# Patient Record
Sex: Male | Born: 1960 | Race: White | Hispanic: No | Smoking: Never smoker
Health system: Southern US, Community
[De-identification: ages and names within clinical notes are randomized; demographics above are authoritative.]

## PROBLEM LIST (undated history)

## (undated) DIAGNOSIS — E119 Type 2 diabetes mellitus without complications: Secondary | ICD-10-CM

## (undated) DIAGNOSIS — I10 Essential (primary) hypertension: Secondary | ICD-10-CM

## (undated) DIAGNOSIS — H409 Unspecified glaucoma: Secondary | ICD-10-CM

## (undated) DIAGNOSIS — N200 Calculus of kidney: Secondary | ICD-10-CM

## (undated) HISTORY — PX: CARPAL TUNNEL RELEASE: SHX101

## (undated) HISTORY — PX: KNEE ARTHROSCOPY: SUR90

## (undated) HISTORY — PX: SHOULDER ARTHROSCOPY: SHX128

---

## 2017-05-14 ENCOUNTER — Emergency Department (HOSPITAL_COMMUNITY)
Admission: EM | Admit: 2017-05-14 | Discharge: 2017-05-14 | Disposition: A | Discharge status: Home / Self Care | Payer: Medicare HMO | Attending: Emergency Medicine | Admitting: Emergency Medicine

## 2017-05-14 ENCOUNTER — Other Ambulatory Visit: Payer: Self-pay

## 2017-05-14 ENCOUNTER — Emergency Department (HOSPITAL_COMMUNITY): Payer: Medicare HMO

## 2017-05-14 ENCOUNTER — Encounter (HOSPITAL_COMMUNITY): Payer: Self-pay | Admitting: *Deleted

## 2017-05-14 DIAGNOSIS — L03115 Cellulitis of right lower limb: Secondary | ICD-10-CM

## 2017-05-14 DIAGNOSIS — R2241 Localized swelling, mass and lump, right lower limb: Secondary | ICD-10-CM | POA: Diagnosis present

## 2017-05-14 DIAGNOSIS — I10 Essential (primary) hypertension: Secondary | ICD-10-CM | POA: Insufficient documentation

## 2017-05-14 DIAGNOSIS — E119 Type 2 diabetes mellitus without complications: Secondary | ICD-10-CM | POA: Insufficient documentation

## 2017-05-14 HISTORY — DX: Calculus of kidney: N20.0

## 2017-05-14 HISTORY — DX: Essential (primary) hypertension: I10

## 2017-05-14 HISTORY — DX: Unspecified glaucoma: H40.9

## 2017-05-14 HISTORY — DX: Type 2 diabetes mellitus without complications: E11.9

## 2017-05-14 LAB — COMPREHENSIVE METABOLIC PANEL
ALBUMIN: 3.5 g/dL (ref 3.5–5.0)
ALK PHOS: 76 U/L (ref 38–126)
ALT: 17 U/L (ref 17–63)
ANION GAP: 4 — AB (ref 5–15)
AST: 22 U/L (ref 15–41)
BUN: 8 mg/dL (ref 6–20)
CALCIUM: 8.8 mg/dL — AB (ref 8.9–10.3)
CO2: 26 mmol/L (ref 22–32)
Chloride: 104 mmol/L (ref 101–111)
Creatinine, Ser: 0.73 mg/dL (ref 0.61–1.24)
GFR calc Af Amer: >60 mL/min (ref >60)
GFR calc non Af Amer: >60 mL/min (ref >60)
GLUCOSE: 264 mg/dL — AB (ref 65–99)
Potassium: 4 mmol/L (ref 3.5–5.1)
SODIUM: 134 mmol/L — AB (ref 135–145)
Total Bilirubin: 1.5 mg/dL — ABNORMAL HIGH (ref 0.3–1.2)
Total Protein: 6.8 g/dL (ref 6.5–8.1)

## 2017-05-14 LAB — URINALYSIS, ROUTINE W REFLEX MICROSCOPIC
BACTERIA UA: NONE SEEN
Bilirubin Urine: NEGATIVE
Glucose, UA: >=500 mg/dL — AB
Hgb urine dipstick: NEGATIVE
Ketones, ur: NEGATIVE mg/dL
LEUKOCYTES UA: NEGATIVE
Nitrite: NEGATIVE
PH: 6 (ref 5.0–8.0)
Protein, ur: NEGATIVE mg/dL
SPECIFIC GRAVITY, URINE: 1.02 (ref 1.005–1.030)

## 2017-05-14 LAB — CBC WITH DIFFERENTIAL/PLATELET
BASOS ABS: 0.1 10*3/uL (ref 0.0–0.1)
BASOS PCT: 1 %
EOS ABS: 0.2 10*3/uL (ref 0.0–0.7)
Eosinophils Relative: 3 %
HCT: 44.2 % (ref 39.0–52.0)
HEMOGLOBIN: 15.4 g/dL (ref 13.0–17.0)
Lymphocytes Relative: 28 %
Lymphs Abs: 2 10*3/uL (ref 0.7–4.0)
MCH: 29.4 pg (ref 26.0–34.0)
MCHC: 34.8 g/dL (ref 30.0–36.0)
MCV: 84.4 fL (ref 78.0–100.0)
MONOS PCT: 9 %
Monocytes Absolute: 0.6 10*3/uL (ref 0.1–1.0)
NEUTROS ABS: 4.2 10*3/uL (ref 1.7–7.7)
NEUTROS PCT: 59 %
Platelets: 117 10*3/uL — ABNORMAL LOW (ref 150–400)
RBC: 5.24 MIL/uL (ref 4.22–5.81)
RDW: 13.8 % (ref 11.5–15.5)
WBC: 7.1 10*3/uL (ref 4.0–10.5)

## 2017-05-14 MED ORDER — HYDROCODONE-ACETAMINOPHEN 5-325 MG PO TABS
1.0000 | ORAL_TABLET | Freq: Once | ORAL | Status: AC
  Administered 2017-05-14: 1 via ORAL
  Filled 2017-05-14: qty 1

## 2017-05-14 MED ORDER — CLINDAMYCIN HCL 300 MG PO CAPS: 300.0000 mg | ORAL_CAPSULE | Freq: Four times a day (QID) | ORAL | 0 refills | Status: AC

## 2017-05-14 MED ORDER — CLINDAMYCIN PHOSPHATE 600 MG/50ML IV SOLN
600.0000 mg | Freq: Once | INTRAVENOUS | Status: AC
  Administered 2017-05-14: 600 mg via INTRAVENOUS
  Filled 2017-05-14: qty 50

## 2017-05-14 MED ORDER — HYDROCODONE-ACETAMINOPHEN 5-325 MG PO TABS
1.0000 | ORAL_TABLET | Freq: Four times a day (QID) | ORAL | 0 refills | Status: AC | PRN
Start: 2017-05-14 — End: ?

## 2017-05-14 NOTE — ED Triage Notes (Signed)
Pt c/o swelling, redness, pain to RLE x couple of days.

## 2017-05-14 NOTE — ED Notes (Signed)
ED Provider at bedside. 

## 2017-05-14 NOTE — Discharge Instructions (Signed)
Follow-up with your primary provider early next week to reevaluate improvement of your cellulitis.  Return immediately for any worsening pain, fever, redness or concerns

## 2017-05-14 NOTE — ED Provider Notes (Signed)
[facility] EMERGENCY DEPARTMENT Provider Note   CSN: [id number] Arrival date & time: 05/14/17  4098     History   Chief Complaint Chief Complaint  Patient presents with  . Leg Swelling    HPI Logan Gibson is a 56 y.o. male.  HPI Patient states he was splitting wood 3 days ago and sustained abrasions to the right pretibial area.  Over the last 2 days he has had redness, warmth and swelling to the area.  Admits to fatigue and generalized weakness but denies any fever or chills.  No shortness of breath or chest pain.  States he has had cellulitis in the past with similar symptoms. Past Medical History:  Diagnosis Date  . Diabetes mellitus without complication (HCC)   . Glaucoma   . Hypertension   . Kidney stones     There are no active problems to display for this patient.   Past Surgical History:  Procedure Laterality Date  . CARPAL TUNNEL RELEASE Bilateral   . KNEE ARTHROSCOPY Bilateral   . SHOULDER ARTHROSCOPY Left        Home Medications    Prior to Admission medications   Medication Sig Start Date End Date Taking? Authorizing Provider  clindamycin (CLEOCIN) 300 MG capsule Take 1 capsule (300 mg total) by mouth 4 (four) times daily. X 7 days 05/14/17   Loren Racer, MD  HYDROcodone-acetaminophen [facility]) 5-325 MG tablet Take 1 tablet by mouth every 6 (six) hours as needed for severe pain. 05/14/17   Loren Racer, MD    Family History No family history on file.  Social History Social History   Tobacco Use  . Smoking status: Never Smoker  . Smokeless tobacco: Never Used  Substance Use Topics  . Alcohol use: No    Frequency: Never  . Drug use: No     Allergies   Tramadol   Review of Systems Review of Systems  Constitutional: Positive for fatigue. Negative for chills and fever.  Respiratory: Negative for cough and shortness of breath.   Cardiovascular: Positive for leg swelling. Negative for chest pain.  Gastrointestinal: Negative for  abdominal pain, nausea and vomiting.  Genitourinary: Negative for dysuria and frequency.  Musculoskeletal: Negative for arthralgias, back pain and joint swelling.  Skin: Positive for color change, rash and wound.  Neurological: Positive for weakness. Negative for dizziness, light-headedness, numbness and headaches.  All other systems reviewed and are negative.    Physical Exam Updated Vital Signs BP 136/76 (BP Location: Left Arm)   Pulse 82   Temp 97.8 F (36.6 C) (Oral)   Resp 18   Ht 5\' 8"  (1.727 m)   Wt 122.5 kg (270 lb)   SpO2 99%   BMI 41.05 kg/m   Physical Exam  Constitutional: He is oriented to person, place, and time. He appears well-developed and well-nourished. No distress.  HENT:  Head: Normocephalic and atraumatic.  Mouth/Throat: Oropharynx is clear and moist.  Eyes: EOM are normal. Pupils are equal, round, and reactive to light.  Neck: Normal range of motion. Neck supple.  Cardiovascular: Normal rate and regular rhythm.  Pulmonary/Chest: Effort normal and breath sounds normal.  Abdominal: Soft. Bowel sounds are normal. There is no tenderness. There is no rebound and no guarding.  Musculoskeletal: Normal range of motion. He exhibits edema and tenderness.  Patient has multiple small superficial abrasions to the pretibial region on the right.  There is surrounding erythema, warmth and tenderness to palpation.  No calf tenderness.  Distal pulses are 2+.  Full range of motion of the right knee and ankle without discomfort.  Neurological: He is alert and oriented to person, place, and time.  Skin: Skin is warm and dry. No rash noted. No erythema.  Psychiatric: He has a normal mood and affect. His behavior is normal.  Nursing note and vitals reviewed.    ED Treatments / Results  Labs (all labs ordered are listed, but only abnormal results are displayed) Labs Reviewed  CBC WITH DIFFERENTIAL/PLATELET - Abnormal; Notable for the following components:      Result Value    Platelets 117 (*)    All other components within normal limits  COMPREHENSIVE METABOLIC PANEL - Abnormal; Notable for the following components:   Sodium 134 (*)    Glucose, Bld 264 (*)    Calcium 8.8 (*)    Total Bilirubin 1.5 (*)    Anion gap 4 (*)    All other components within normal limits  URINALYSIS, ROUTINE W REFLEX MICROSCOPIC - Abnormal; Notable for the following components:   Glucose, UA >=500 (*)    Squamous Epithelial / LPF 0-5 (*)    All other components within normal limits    EKG  EKG Interpretation None       Radiology [place] Venous Img Lower Unilateral Right  Result Date: 05/14/2017 CLINICAL DATA:  56 year old male with a history of right lower extremity edema EXAM: RIGHT LOWER EXTREMITY VENOUS DOPPLER ULTRASOUND TECHNIQUE: Gray-scale sonography with graded compression, as well as color Doppler and duplex ultrasound were performed to evaluate the lower extremity deep venous systems from the level of the common femoral vein and including the common femoral, femoral, profunda femoral, popliteal and calf veins including the posterior tibial, peroneal and gastrocnemius veins when visible. The superficial great saphenous vein was also interrogated. Spectral Doppler was utilized to evaluate flow at rest and with distal augmentation maneuvers in the common femoral, femoral and popliteal veins. COMPARISON:  None. FINDINGS: Contralateral Common Femoral Vein: Respiratory phasicity is normal and symmetric with the symptomatic side. No evidence of thrombus. Normal compressibility. Common Femoral Vein: No evidence of thrombus. Normal compressibility, respiratory phasicity and response to augmentation. Saphenofemoral Junction: No evidence of thrombus. Normal compressibility and flow on color Doppler imaging. Profunda Femoral Vein: No evidence of thrombus. Normal compressibility and flow on color Doppler imaging. Femoral Vein: No evidence of thrombus. Normal compressibility, respiratory  phasicity and response to augmentation. Popliteal Vein: No evidence of thrombus. Normal compressibility, respiratory phasicity and response to augmentation. Calf Veins: No evidence of thrombus. Normal compressibility and flow on color Doppler imaging. Superficial Great Saphenous Vein: No evidence of thrombus. Normal compressibility and flow on color Doppler imaging. Other Findings:  Mild edema. IMPRESSION: Sonographic survey of the right lower extremity negative for DVT. Mild edema of the calf. Electronically Signed   By: Gilmer MorJaime  Wagner D.O.   On: 05/14/2017 11:56    Procedures Procedures (including critical care time)  Medications Ordered in ED Medications  clindamycin (CLEOCIN) IVPB 600 mg (0 mg Intravenous Stopped 05/14/17 1254)  HYDROcodone-acetaminophen (NORCO/VICODIN) 5-325 MG per tablet 1 tablet (1 tablet Oral Given 05/14/17 1259)     Initial Impression / Assessment and Plan / ED Course  I have reviewed the triage vital signs and the nursing notes.  Pertinent labs & imaging results that were available during my care of the patient were reviewed by me and considered in my medical decision making (see chart for details).     Patient is very well-appearing.  Vital signs are stable.  Afebrile and normal white blood cell count.  Given dose of IV clindamycin in the emergency department.  Patient states he would like to try oral antibiotics.  Will follow up with his primary physician next week and return immediately for any worsening symptoms or concerns.  Final Clinical Impressions(s) / ED Diagnoses   Final diagnoses:  Cellulitis of right leg    ED Discharge Orders        Ordered    clindamycin (CLEOCIN) 300 MG capsule  4 times daily     05/14/17 1249    HYDROcodone-acetaminophen (NORCO) 5-325 MG tablet  Every 6 hours PRN     05/14/17 1249       Loren RacerYelverton, Babyboy Loya, MD 05/14/17 1301

## 2019-04-19 IMAGING — US US EXTREM LOW VENOUS*R*
1 series · 13 of 24 positions shown · non-contrast
Comparison: None.

CLINICAL DATA: 56-year-old male with a history of right lower
extremity edema



[Series 1: us extrem low venous*right* · 0.10mm/px · 13 of 38 slices shown]
[im 1/38]
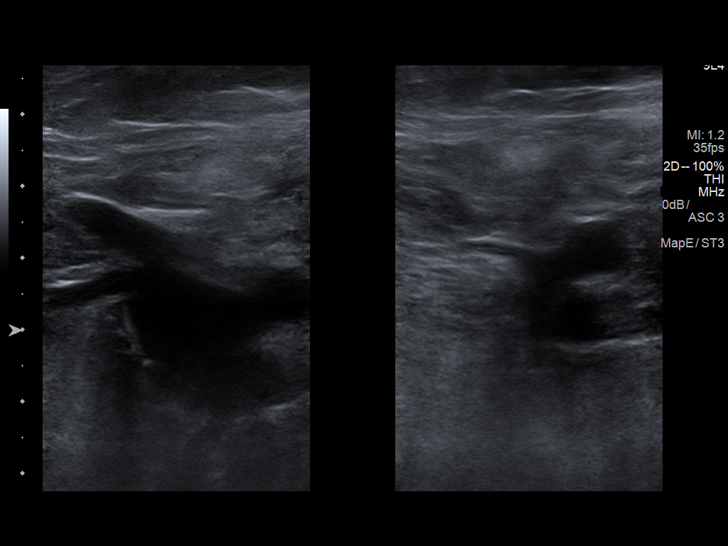
[im 4/38]
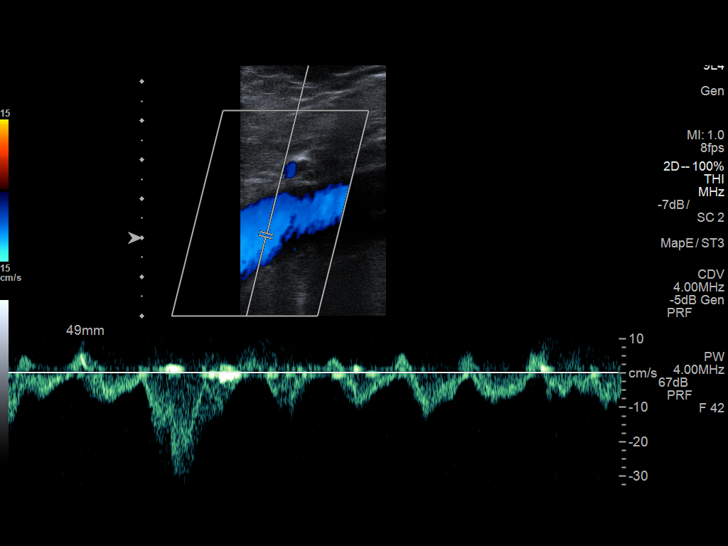
[im 7/38]
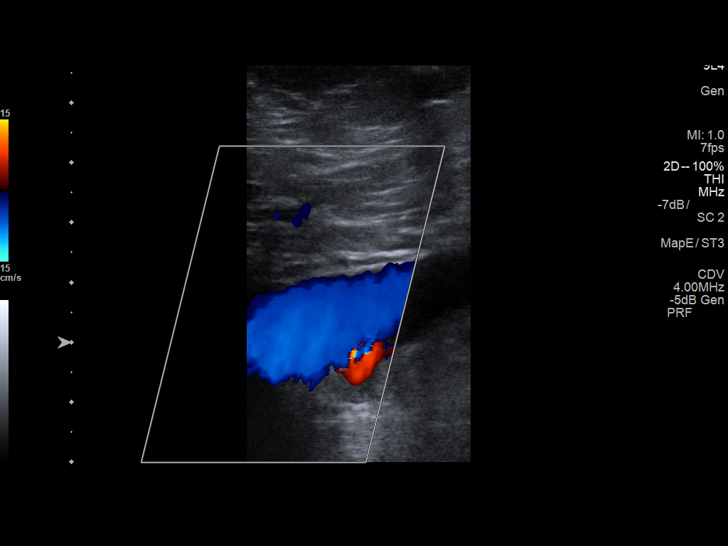
[im 10/38]
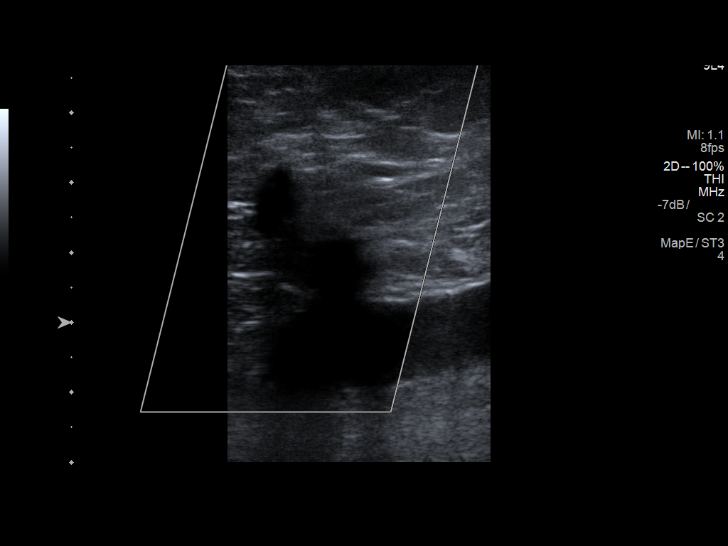
[im 13/38]
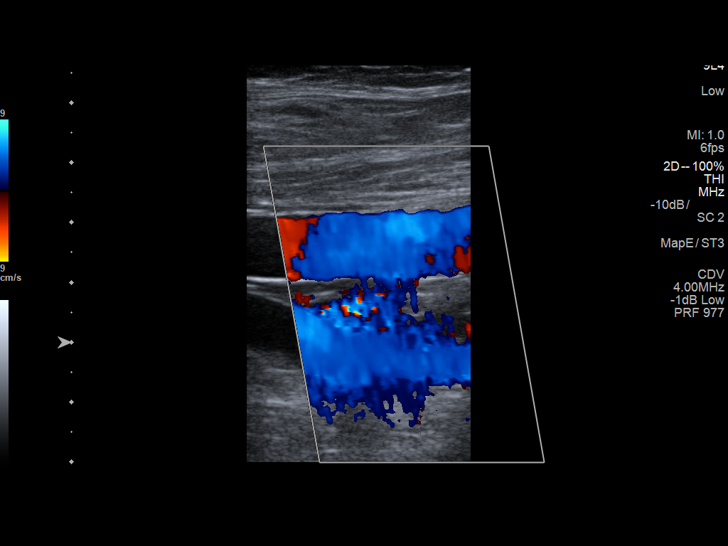
[im 17/38]
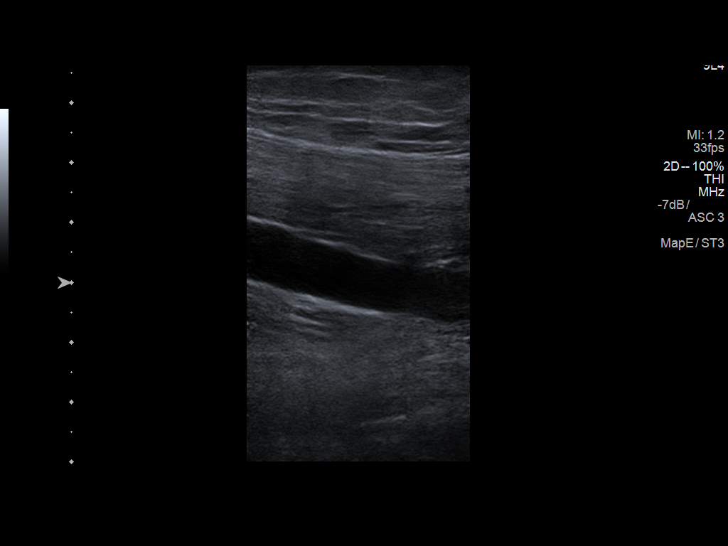
[im 20/38]
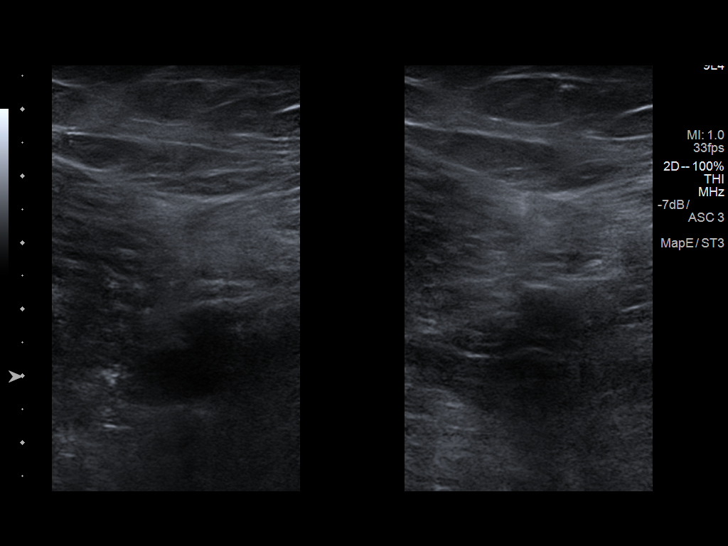
[im 21/38]
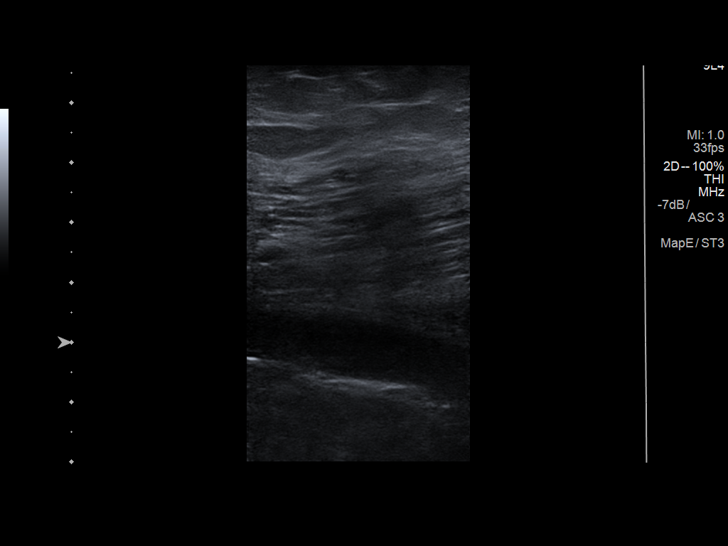
[im 25/38]
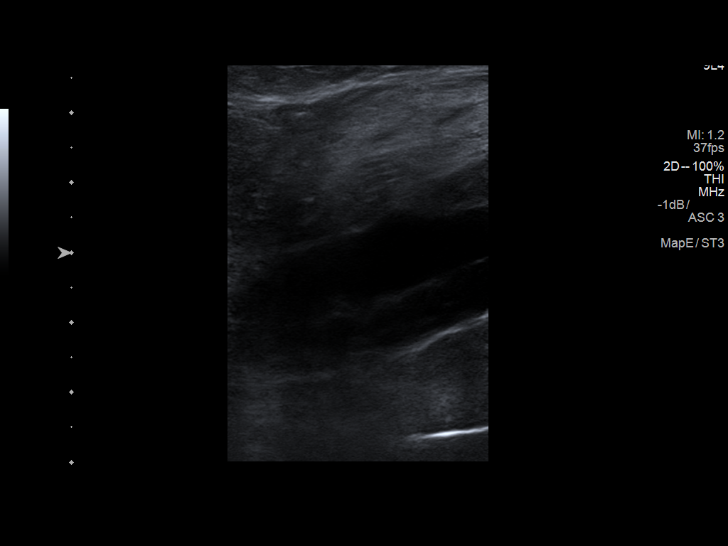
[im 28/38]
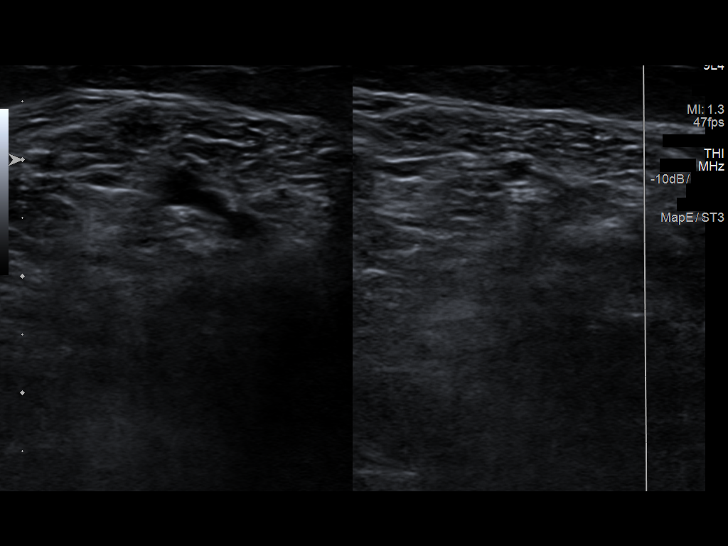
[im 31/38]
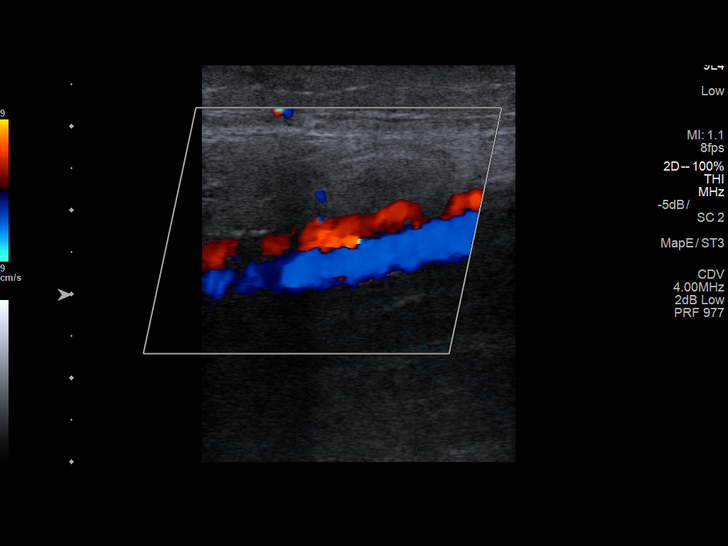
[im 34/38]
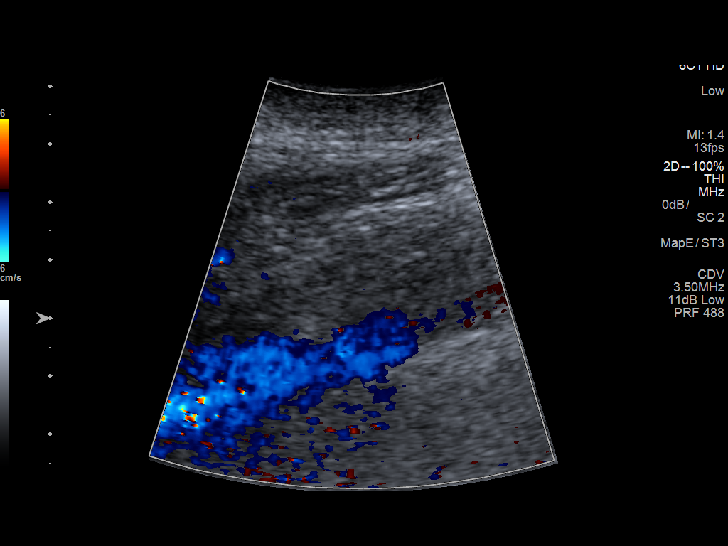
[im 38/38]
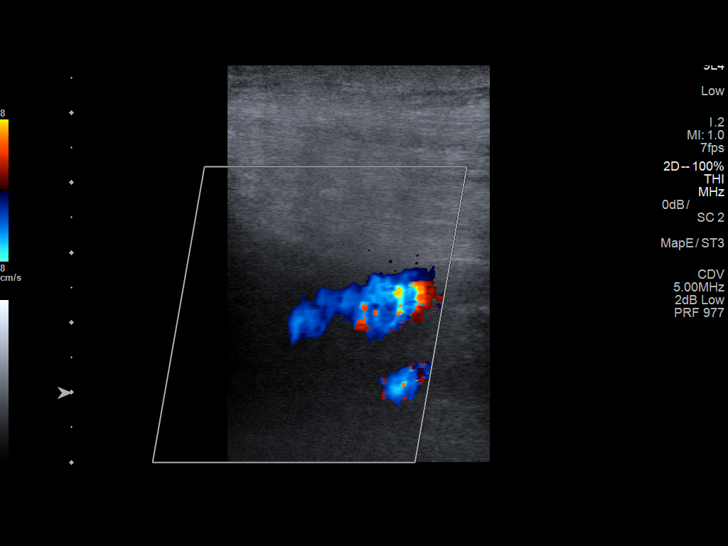

[13 of 24 positions shown; findings below may reference images not displayed]

FINDINGS: Contralateral Common Femoral Vein: Respiratory phasicity is normal
and symmetric with the symptomatic side. No evidence of thrombus.
Normal compressibility.

Common Femoral Vein: No evidence of thrombus. Normal
compressibility, respiratory phasicity and response to augmentation.

Saphenofemoral Junction: No evidence of thrombus. Normal
compressibility and flow on color Doppler imaging.

Profunda Femoral Vein: No evidence of thrombus. Normal
compressibility and flow on color Doppler imaging.

Femoral Vein: No evidence of thrombus. Normal compressibility,
respiratory phasicity and response to augmentation.

Popliteal Vein: No evidence of thrombus. Normal compressibility,
respiratory phasicity and response to augmentation.

Calf Veins: No evidence of thrombus. Normal compressibility and flow
on color Doppler imaging.

Superficial Great Saphenous Vein: No evidence of thrombus. Normal
compressibility and flow on color Doppler imaging.

Other Findings:  Mild edema.
IMPRESSION: Sonographic survey of the right lower extremity negative for DVT.

Mild edema of the calf.

## 2022-01-30 ENCOUNTER — Other Ambulatory Visit: Payer: Self-pay

## 2022-01-30 ENCOUNTER — Emergency Department (HOSPITAL_BASED_OUTPATIENT_CLINIC_OR_DEPARTMENT_OTHER)
Admission: EM | Admit: 2022-01-30 | Discharge: 2022-01-30 | Disposition: A | Discharge status: Home / Self Care | Payer: Medicare HMO | Attending: Emergency Medicine | Admitting: Emergency Medicine

## 2022-01-30 ENCOUNTER — Emergency Department (HOSPITAL_BASED_OUTPATIENT_CLINIC_OR_DEPARTMENT_OTHER): Payer: Medicare HMO

## 2022-01-30 ENCOUNTER — Encounter (HOSPITAL_BASED_OUTPATIENT_CLINIC_OR_DEPARTMENT_OTHER): Payer: Self-pay

## 2022-01-30 DIAGNOSIS — R112 Nausea with vomiting, unspecified: Secondary | ICD-10-CM

## 2022-01-30 DIAGNOSIS — R109 Unspecified abdominal pain: Secondary | ICD-10-CM | POA: Diagnosis present

## 2022-01-30 DIAGNOSIS — E119 Type 2 diabetes mellitus without complications: Secondary | ICD-10-CM | POA: Diagnosis not present

## 2022-01-30 DIAGNOSIS — R1010 Upper abdominal pain, unspecified: Secondary | ICD-10-CM

## 2022-01-30 DIAGNOSIS — K7460 Unspecified cirrhosis of liver: Secondary | ICD-10-CM

## 2022-01-30 DIAGNOSIS — K7030 Alcoholic cirrhosis of liver without ascites: Secondary | ICD-10-CM | POA: Diagnosis not present

## 2022-01-30 DIAGNOSIS — I10 Essential (primary) hypertension: Secondary | ICD-10-CM | POA: Diagnosis not present

## 2022-01-30 LAB — CBC
HCT: 38.6 % — ABNORMAL LOW (ref 39.0–52.0)
Hemoglobin: 14.1 g/dL (ref 13.0–17.0)
MCH: 31.5 pg (ref 26.0–34.0)
MCHC: 36.5 g/dL — ABNORMAL HIGH (ref 30.0–36.0)
MCV: 86.2 fL (ref 80.0–100.0)
Platelets: 101 10*3/uL — ABNORMAL LOW (ref 150–400)
RBC: 4.48 MIL/uL (ref 4.22–5.81)
RDW: 13.3 % (ref 11.5–15.5)
WBC: 10.2 10*3/uL (ref 4.0–10.5)
nRBC: 0 % (ref 0.0–0.2)

## 2022-01-30 LAB — URINALYSIS, ROUTINE W REFLEX MICROSCOPIC
Glucose, UA: NEGATIVE mg/dL
Ketones, ur: NEGATIVE mg/dL
Leukocytes,Ua: NEGATIVE
Nitrite: POSITIVE — AB
Protein, ur: 100 mg/dL — AB
Specific Gravity, Urine: 1.025 (ref 1.005–1.030)
pH: 6 (ref 5.0–8.0)

## 2022-01-30 LAB — COMPREHENSIVE METABOLIC PANEL
ALT: 13 U/L (ref 0–44)
AST: 26 U/L (ref 15–41)
Albumin: 3 g/dL — ABNORMAL LOW (ref 3.5–5.0)
Alkaline Phosphatase: 69 U/L (ref 38–126)
Anion gap: 7 (ref 5–15)
BUN: 13 mg/dL (ref 8–23)
CO2: 22 mmol/L (ref 22–32)
Calcium: 8.4 mg/dL — ABNORMAL LOW (ref 8.9–10.3)
Chloride: 103 mmol/L (ref 98–111)
Creatinine, Ser: 0.85 mg/dL (ref 0.61–1.24)
GFR, Estimated: >60 mL/min (ref >60)
Glucose, Bld: 196 mg/dL — ABNORMAL HIGH (ref 70–99)
Potassium: 4 mmol/L (ref 3.5–5.1)
Sodium: 132 mmol/L — ABNORMAL LOW (ref 135–145)
Total Bilirubin: 3 mg/dL — ABNORMAL HIGH (ref 0.3–1.2)
Total Protein: 6.4 g/dL — ABNORMAL LOW (ref 6.5–8.1)

## 2022-01-30 LAB — URINALYSIS, MICROSCOPIC (REFLEX)

## 2022-01-30 LAB — LIPASE, BLOOD: Lipase: 24 U/L (ref 11–51)

## 2022-01-30 MED ORDER — SODIUM CHLORIDE 0.9 % IV SOLN: 1000.0000 mL | INTRAVENOUS | Status: DC

## 2022-01-30 MED ORDER — IOHEXOL 300 MG/ML  SOLN
125.0000 mL | Freq: Once | INTRAMUSCULAR | Status: AC | PRN
  Administered 2022-01-30: 125 mL via INTRAVENOUS

## 2022-01-30 MED ORDER — KETOROLAC TROMETHAMINE 15 MG/ML IJ SOLN
15.0000 mg | Freq: Once | INTRAMUSCULAR | Status: AC
  Administered 2022-01-30: 15 mg via INTRAVENOUS
  Filled 2022-01-30: qty 1

## 2022-01-30 MED ORDER — MORPHINE SULFATE (PF) 4 MG/ML IV SOLN
4.0000 mg | Freq: Once | INTRAVENOUS | Status: AC
  Administered 2022-01-30: 4 mg via INTRAVENOUS
  Filled 2022-01-30: qty 1

## 2022-01-30 MED ORDER — CEPHALEXIN 500 MG PO CAPS: 1000.0000 mg | ORAL_CAPSULE | Freq: Two times a day (BID) | ORAL | 0 refills | Status: AC

## 2022-01-30 MED ORDER — ONDANSETRON HCL 4 MG/2ML IJ SOLN
4.0000 mg | Freq: Once | INTRAMUSCULAR | Status: AC
  Administered 2022-01-30: 4 mg via INTRAVENOUS
  Filled 2022-01-30: qty 2

## 2022-01-30 MED ORDER — SODIUM CHLORIDE 0.9 % IV BOLUS (SEPSIS)
1000.0000 mL | Freq: Once | INTRAVENOUS | Status: AC
  Administered 2022-01-30: 1000 mL via INTRAVENOUS

## 2022-01-30 NOTE — Discharge Instructions (Addendum)
Patient to follow-up with primary care physician to further discuss the plan and recommendations for patient's abdominal pain and CT findings. If abdominal pain or fever worsens patient will return to emergency department for evaluation. Continue the prescribed course of antibiotic.

## 2022-01-30 NOTE — ED Notes (Signed)
Pt d/c home with family per MD order. Discharge summary reviewed, pt verbalizes understanding . No s/s of acute distress noted at discharge. Reports daughter is discharge ride home.

## 2022-01-30 NOTE — ED Triage Notes (Signed)
Patient c/o upper abd pain x several days - states was seen in the ED 2 days ago and diagnosed with gallstones. States pain has not gotten better and reported fevers.

## 2022-01-30 NOTE — ED Provider Notes (Signed)
[place] EMERGENCY DEPARTMENT Provider Note   CSN: [id number] Arrival date & time: 01/30/22  1709     History  Chief Complaint  Patient presents with   Abdominal Pain    Logan Gibson is a 61 y.o. male.  61 year old male with history of hypertension, diabetes mellitus type 2, kidney stones presented to emergency department with complaints of abdominal pain. Patient reports abdominal pain on and off for 7 days, worse for past 2 to 3 days.  Pain predominantly across the upper abdomen radiating to the back, associated with nausea and vomiting and low-grade fever.  Patient reports he was seen at [facility] emergency department 3 days ago for similar symptoms.  Patient stated CT scan was done which showed gallstones and kidney stones.  Patient was given injection of Toradol and discharged.   Patient reports pain has gradually worsened.  Patient is not able to eat or drink secondary to pain.  Patient also reporting dysuria with started this morning. Patient states he ran out of Trulicity for past 1 month.  He is regularly checking his blood sugar and fasting blood sugar fluctuates between 120-130. No acute visible distress.  Patient comfortably communicating.   Abdominal Pain Associated symptoms: chills, dysuria, fever and nausea   Associated symptoms: no constipation and no diarrhea        Home Medications Prior to Admission medications   Medication Sig Start Date End Date Taking? Authorizing Provider  cephALEXin (KEFLEX) 500 MG capsule Take 2 capsules (1,000 mg total) by mouth 2 (two) times daily for 10 days. 01/30/22 02/09/22 Yes Ediberto Sens, MD  clindamycin (CLEOCIN) 300 MG capsule Take 1 capsule (300 mg total) by mouth 4 (four) times daily. X 7 days 05/14/17   Julianne Rice, MD  HYDROcodone-acetaminophen [facility]) 5-325 MG tablet Take 1 tablet by mouth every 6 (six) hours as needed for severe pain. 05/14/17   Julianne Rice, MD      Allergies    Tramadol     Review of Systems   Review of Systems  Constitutional:  Positive for appetite change, chills and fever.  Gastrointestinal:  Positive for abdominal pain, nausea and rectal pain. Negative for constipation and diarrhea.  Genitourinary:  Positive for dysuria.  Skin:  Negative for color change and rash.  Neurological:  Negative for dizziness and headaches.    Physical Exam Updated Vital Signs BP (!) 150/69   Pulse 96   Temp 99.5 F (37.5 C) (Oral)   Resp 18   Ht 5\' 8"  (1.727 m)   Wt 108.9 kg   SpO2 99%   BMI 36.49 kg/m  Physical Exam Vitals and nursing note reviewed.  Constitutional:      Appearance: He is well-developed.  HENT:     Head: Normocephalic and atraumatic.  Cardiovascular:     Rate and Rhythm: Regular rhythm. Tachycardia present.     Heart sounds: Normal heart sounds.  Pulmonary:     Effort: Pulmonary effort is normal.     Breath sounds: Normal breath sounds.  Abdominal:     General: Bowel sounds are normal. There is no distension or abdominal bruit.     Palpations: Abdomen is soft. There is no mass or pulsatile mass.     Tenderness: There is abdominal tenderness in the right upper quadrant, epigastric area and left upper quadrant. There is left CVA tenderness and guarding. There is no rebound. Positive signs include Murphy's sign. Negative signs include psoas sign.     Hernia: No hernia is  present.       Comments: Diffuse upper abdomen tenderness with guarding   Skin:    General: Skin is warm.     Capillary Refill: Capillary refill takes less than 2 seconds.     Coloration: Skin is not jaundiced.  Neurological:     General: No focal deficit present.     Mental Status: He is alert and oriented to person, place, and time.     ED Results / Procedures / Treatments   Labs (all labs ordered are listed, but only abnormal results are displayed) Labs Reviewed  COMPREHENSIVE METABOLIC PANEL - Abnormal; Notable for the following components:      Result Value    Sodium 132 (*)    Glucose, Bld 196 (*)    Calcium 8.4 (*)    Total Protein 6.4 (*)    Albumin 3.0 (*)    Total Bilirubin 3.0 (*)    All other components within normal limits  CBC - Abnormal; Notable for the following components:   HCT 38.6 (*)    MCHC 36.5 (*)    Platelets 101 (*)    All other components within normal limits  URINALYSIS, ROUTINE W REFLEX MICROSCOPIC - Abnormal; Notable for the following components:   Color, Urine AMBER (*)    Hgb urine dipstick MODERATE (*)    Bilirubin Urine SMALL (*)    Protein, ur 100 (*)    Nitrite POSITIVE (*)    All other components within normal limits  URINALYSIS, MICROSCOPIC (REFLEX) - Abnormal; Notable for the following components:   Bacteria, UA RARE (*)    All other components within normal limits  LIPASE, BLOOD    EKG None  Radiology CT ABDOMEN PELVIS W CONTRAST  Result Date: 01/30/2022 CLINICAL DATA:  Nausea, vomiting, abdominal pain. EXAM: CT ABDOMEN AND PELVIS WITH CONTRAST TECHNIQUE: Multidetector CT imaging of the abdomen and pelvis was performed using the standard protocol following bolus administration of intravenous contrast. RADIATION DOSE REDUCTION: This exam was performed according to the departmental dose-optimization program which includes automated exposure control, adjustment of the mA and/or kV according to patient size and/or use of iterative reconstruction technique. CONTRAST:  OMNIPAQUE IOHEXOL 300 MG/ML  SOLN COMPARISON:  None Available. FINDINGS: Lower chest: Extensive coronary artery calcification. Global cardiac size within normal limits. Visualized lung bases are clear. Hepatobiliary: Cirrhosis. Scattered benign calcified granuloma are seen throughout the liver in keeping with changes of old granulomatous disease. No enhancing intrahepatic masses are identified. No intra or extrahepatic biliary ductal dilation. Cholelithiasis noted without pericholecystic inflammatory change. Pancreas: Unremarkable Spleen:  Mild splenomegaly with the spleen measuring up to 15.2 cm in greatest dimension. No intrasplenic lesions are identified. The splenic capsular calcifications noted laterally may relate to remote trauma. Adrenals/Urinary Tract: The adrenal glands are unremarkable. The kidneys are normal in size and position. Two nonobstructing calculi are seen within the lower pole left kidney measuring up to 9 mm in greatest dimension. No hydronephrosis. 15 mm simple cortical cyst noted within the lower pole the right kidney. No follow-up imaging is recommended this lesion. No ureteral calculi. The bladder is unremarkable. Stomach/Bowel: Stomach is within normal limits. Appendix appears normal. No evidence of bowel wall thickening, distention, or inflammatory changes. Vascular/Lymphatic: Large splenorenal shunt identified reflecting changes of portal venous hypertension. Mild aortoiliac atherosclerotic calcification. No aortic aneurysm. No pathologic adenopathy within the abdomen and pelvis. Reproductive: Mild prostatic hypertrophy. Other: No abdominal wall hernia. Musculoskeletal: No acute or significant osseous findings. IMPRESSION: 1. No acute  intra-abdominal pathology identified. No definite radiographic explanation for the patient's reported symptoms. 2. 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 3. Cholelithiasis. 4. Mild left nonobstructing nephrolithiasis. No urolithiasis. No hydronephrosis. 5. Extensive coronary artery calcification. Electronically Signed   By: Fidela Salisbury M.D.   On: 01/30/2022 21:09    Procedures Procedures    Medications Ordered in ED Medications  sodium chloride 0.9 % bolus 1,000 mL (0 mLs Intravenous Stopped 01/30/22 2144)    Followed by  0.9 %  sodium chloride infusion (has no administration in time range)  morphine (PF) 4 MG/ML injection 4 mg (4 mg Intravenous Given 01/30/22 2023)  ondansetron (ZOFRAN) injection 4 mg (4 mg  Intravenous Given 01/30/22 2021)  iohexol (OMNIPAQUE) 300 MG/ML solution 125 mL (125 mLs Intravenous Contrast Given 01/30/22 2031)  ketorolac (TORADOL) 15 MG/ML injection 15 mg (15 mg Intravenous Given 01/30/22 2145)    ED Course/ Medical Decision Making/ A&P Clinical Course as of 01/30/22 2209  Thu Jan 30, 2022  1947 Lipase: 24 [BM]    Clinical Course User Index [BM] Teola Bradley, MD                           Medical Decision Making 61 year old male with history of hypertension, diabetes mellitus type 2 presented to emergency department with complaints of weakness upper abdominal pain. Patient was pain across the upper abdomen radiating to the back for the past week, worse for the past few days.  Abdominal pain associated with nausea, vomiting, fever that started 2 days ago and dysuria which started this morning.patient denied blood in the vomitus.  Denies CP, SOB, palpitations or dizziness. Diffuse tenderness across upper abdomen with guarding to epigastric and right upper quadrant.  No pulsation or mass appreciated. No scleral icterus. No pedal edema. Patient comfortably communicating without acute physical distress.  CBC unremarkable. CMP: trace low sodium, elevated glucose and elevated T. bili.  Otherwise unremarkable.  UA shows positive for nitrites without WBC/leukocyte esterase. Pt febrile with temp of 100.  Differential diagnosis includes.  Cholecystitis, pancreatitis, gastritis, Renal calculi. We will infuse bolus of IV fluid. IV morphine 4 mg, Zofran for symptomatic relief for pain and nausea vomiting respectively.  We will do CT scan abdomen pelvis to rule out pancreatitis, cholecystitis, nephrolithiasis.  CT abdomen pelvis: No acute abdominal pathology.  Cholelithiasis, left nonobstructing nephrolithiasis without hydronephrosis.  Incidental finding of cirrhosis with evidence of portal hypertension.  CT findings were discussed with the patient.  While patient stayed in the  emergency department his symptoms improved.  Mild lingering pain in the upper abdomen.  Nausea, vomiting resolved.  UA demonstrated nitrites however no white blood cell elevation which is nonspecific for UTI.  However I will start the patient on antibiotic for suspected UTI.  Encouraged to increase hydration.  Patient in no distress and overall condition improved here in the ED. Detailed discussions were had with the patient regarding current findings, and need for close f/u with PCP or on call doctor.  Patient has appointment with primary care physician on September 11.  Patient encouraged to keep the appointment as scheduled with the PCP.  The patient has been instructed to return immediately if the symptoms worsen in any way for re-evaluation. Patient verbalized understanding and is in agreement with current care plan. All questions answered prior to discharge.      Amount and/or Complexity of Data Reviewed Labs: ordered. Decision-making details  documented in ED Course.          Final Clinical Impression(s) / ED Diagnoses Final diagnoses:  Pain of upper abdomen  Nausea and vomiting, unspecified vomiting type  Cirrhosis of liver without ascites, unspecified hepatic cirrhosis type [facility])    Rx / DC Orders ED Discharge Orders          Ordered    cephALEXin (KEFLEX) 500 MG capsule  2 times daily        01/30/22 2145              Reinaldo Raddle, MD 01/30/22 2209    Melene Plan, DO 01/31/22 0003
